# Patient Record
Sex: Male | Born: 2000 | Race: White | Hispanic: No | Marital: Single
Health system: Southern US, Academic
[De-identification: ages and names within clinical notes are randomized; demographics above are authoritative.]

## PROBLEM LIST (undated history)

## (undated) DIAGNOSIS — J45909 Unspecified asthma, uncomplicated: Secondary | ICD-10-CM

## (undated) DIAGNOSIS — F909 Attention-deficit hyperactivity disorder, unspecified type: Secondary | ICD-10-CM

## (undated) HISTORY — DX: Attention-deficit hyperactivity disorder, unspecified type: F90.9

---
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
## 2020-01-25 ENCOUNTER — Telehealth: Payer: BC Managed Care – PPO | Admitting: Family

## 2020-08-10 ENCOUNTER — Institutional Professional Consult (permissible substitution): Payer: No Typology Code available for payment source | Admitting: Family

## 2020-10-20 ENCOUNTER — Encounter: Payer: Self-pay | Admitting: Family

## 2020-10-20 ENCOUNTER — Ambulatory Visit (INDEPENDENT_AMBULATORY_CARE_PROVIDER_SITE_OTHER): Payer: No Typology Code available for payment source | Admitting: Family

## 2020-10-20 ENCOUNTER — Other Ambulatory Visit: Payer: Self-pay

## 2020-10-20 VITALS — BP 118/68 | HR 72 | Resp 16 | Ht 68.5 in | Wt 158.0 lb

## 2020-10-20 DIAGNOSIS — F900 Attention-deficit hyperactivity disorder, predominantly inattentive type: Secondary | ICD-10-CM

## 2020-10-20 DIAGNOSIS — Z79899 Other long term (current) drug therapy: Secondary | ICD-10-CM

## 2020-10-20 DIAGNOSIS — R278 Other lack of coordination: Secondary | ICD-10-CM

## 2020-10-20 DIAGNOSIS — F819 Developmental disorder of scholastic skills, unspecified: Secondary | ICD-10-CM | POA: Diagnosis not present

## 2020-10-20 DIAGNOSIS — Z7189 Other specified counseling: Secondary | ICD-10-CM

## 2020-10-20 DIAGNOSIS — Z719 Counseling, unspecified: Secondary | ICD-10-CM

## 2020-10-20 NOTE — Progress Notes (Signed)
Medication Check  Patient ID: Dashiel Bergquist Backlund  DOB: [birth year]  MRN: [id number]  DATE:10/21/20 [name], MD  Accompanied by:  self Patient Lives with: parents  HISTORY/CURRENT STATUS: HPI Patient here with mother for the visit. Patient interactive with provider and answering questions. Patient currently enrolled in online schooling and taking 1 class. Not currently taking his medications and feels that he is doing "fine" without medications. May reconsider medication or options if he moves to [place] to transfer to [employer].  EDUCATION: School: 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   Screen time: (phone, tablet, TV, computer): computer for school work  Driving: NO issues   MEDICAL HISTORY: Appetite: Good with fish oil/D3 Sleep: Bedtime: 2-3:00 am   Awakens: 10:30-11:00 am   Concerns: Initiation/Maintenance/Other: None Elimination: None  Individual Medical History/ Review of Systems: Changes? :None reported  Family Medical/ Social History: Changes? None reported  Current Medications:  Current Outpatient Medications  Medication Instructions   Amphetamine ER (DYANAVEL XR) 2.5 MG/ML SUER 8 mLs, Oral, Daily   tretinoin (RETIN-A) 0.025 % cream APPLY ON THE SKIN NIGHTLY, APPLY DIME SIZE AMOUNT TO ENTIRE FACE AS TOLERATED  *Took medication last fall but not recently  Medication Side Effects: None  MENTAL HEALTH: None  PHYSICAL EXAM; Vitals:   10/20/20 1012  BP: 118/68  Pulse: 72  Resp: 16  Weight: 158 lb (71.7 kg)  Height: 5' 8.5" (1.74 m)   General Physical Exam: Unchanged from previous exam, date:10/12/2019   Side Effects (None 0, Mild 1, Moderate 2, Severe 3)  Headache 0  Stomachache 0  Change of appetite 0  Trouble sleeping 0  Irritability  in the later morning, later afternoon , or evening 0  Socially withdrawn - decreased interaction with others 0  Extreme sadness or unusual crying 0  Dull, tired, listless behavior 0  Tremors/feeling shaky 0  Repetitive movements, tics, jerking, twitching, eye blinking 0  Picking at skin or fingers nail biting, lip or cheek chewing 0  Sees or hears things that aren't there 0  Comments:  None  ASSESSMENT:  Jabril is 3-years of age with a diagnosis of ADHD and Learning disability that is not controlled with medication at this point in time. Eesa is taking 1 online class this semester and back at home. He is not taking his medication with getting A's in his current class. Looking to transfer next year to [employer] for business or continue with online schooling and move to [place]. Unsure if he will restart his medication, but at this time feel that he is doing well without the medicine. He has been working out regularly and eating with no current issues. No changes with medical history since the last f/u visit. To reassess the need for medication or change medications in the next year or sooner.   DIAGNOSES:    ICD-10-CM   1. Attention deficit hyperactivity disorder (ADHD), predominantly inattentive type  F90.0     2. Learning difficulty  F81.9     3. Dysgraphia  R27.8     4. Medication management  Z79.899     5. Goals of care, counseling/discussion  Z71.89     6. Patient counseled  Z71.9      RECOMMENDATIONS:  Reviewed recent changes with school and transferring to online schooling.  Currently doing well in his one online class at [employer].  Looking to transfer to [employer] next year or move to [place] and continue with current online program for business.  Not currently getting any services for learning support with online schooling.   Discussed regular exercise and attending the gym. This is helping with stress relief and focusing on his school work.  Eating with no issues or concerns  getting plenty of calories and protein each day with regular activity.   Discussed sleeping habits and sleep hygiene each night. NO current issues reported by patient.  Reviewed current ability to focus and not currently taking medication. Discussed restarting medication and alternatives for treatment.   Counseled medication pharmacokinetics, options, dosage, administration, desired effects, and possible side effects.   NOT taking his Dyanavel XR daily at this time.   I discussed the assessment and treatment plan with the patient.. The patient was provided an opportunity to ask questions and all were answered. The patient  agreed with the plan and demonstrated an understanding of the instructions.  NEXT APPOINTMENT:  Return in about 1 year (around 10/20/2021), or if symptoms worsen or fail to improve, for medication management.Marland Kitchen

## 2020-10-21 ENCOUNTER — Encounter: Payer: Self-pay | Admitting: Family

## 2020-11-01 ENCOUNTER — Institutional Professional Consult (permissible substitution): Payer: No Typology Code available for payment source | Admitting: Family

## 2021-05-15 DIAGNOSIS — L700 Acne vulgaris: Secondary | ICD-10-CM | POA: Diagnosis not present

## 2021-06-13 DIAGNOSIS — L700 Acne vulgaris: Secondary | ICD-10-CM | POA: Diagnosis not present

## 2021-07-17 DIAGNOSIS — L700 Acne vulgaris: Secondary | ICD-10-CM | POA: Diagnosis not present

## 2021-09-11 DIAGNOSIS — L700 Acne vulgaris: Secondary | ICD-10-CM | POA: Diagnosis not present
